# Patient Record
Sex: Male | Born: 1980 | Hispanic: Yes | Smoking: Never smoker
Health system: Southern US, Community
[De-identification: ages and names within clinical notes are randomized; demographics above are authoritative.]

---

## 2016-03-02 ENCOUNTER — Encounter (HOSPITAL_COMMUNITY): Payer: Self-pay | Admitting: *Deleted

## 2016-03-02 ENCOUNTER — Emergency Department (HOSPITAL_COMMUNITY)
Admission: EM | Admit: 2016-03-02 | Discharge: 2016-03-02 | Disposition: A | Discharge status: Home / Self Care | Payer: Self-pay | Attending: Emergency Medicine | Admitting: Emergency Medicine

## 2016-03-02 DIAGNOSIS — N200 Calculus of kidney: Secondary | ICD-10-CM | POA: Insufficient documentation

## 2016-03-02 LAB — URINE MICROSCOPIC-ADD ON

## 2016-03-02 LAB — URINALYSIS, ROUTINE W REFLEX MICROSCOPIC
Glucose, UA: NEGATIVE mg/dL
Ketones, ur: 15 mg/dL — AB
NITRITE: NEGATIVE
PROTEIN: 100 mg/dL — AB
SPECIFIC GRAVITY, URINE: 1.027 (ref 1.005–1.030)
pH: 5.5 (ref 5.0–8.0)

## 2016-03-02 LAB — COMPREHENSIVE METABOLIC PANEL
ALBUMIN: 4.3 g/dL (ref 3.5–5.0)
ALT: 43 U/L (ref 17–63)
ANION GAP: 10 (ref 5–15)
AST: 28 U/L (ref 15–41)
Alkaline Phosphatase: 64 U/L (ref 38–126)
BUN: 17 mg/dL (ref 6–20)
CHLORIDE: 103 mmol/L (ref 101–111)
CO2: 24 mmol/L (ref 22–32)
Calcium: 9.4 mg/dL (ref 8.9–10.3)
Creatinine, Ser: 0.89 mg/dL (ref 0.61–1.24)
GFR calc Af Amer: >60 mL/min (ref >60)
GLUCOSE: 138 mg/dL — AB (ref 65–99)
POTASSIUM: 3.6 mmol/L (ref 3.5–5.1)
Sodium: 137 mmol/L (ref 135–145)
Total Bilirubin: 0.7 mg/dL (ref 0.3–1.2)
Total Protein: 7.5 g/dL (ref 6.5–8.1)

## 2016-03-02 LAB — CBC
HCT: 42.4 % (ref 39.0–52.0)
HEMOGLOBIN: 14.3 g/dL (ref 13.0–17.0)
MCH: 29.5 pg (ref 26.0–34.0)
MCHC: 33.7 g/dL (ref 30.0–36.0)
MCV: 87.4 fL (ref 78.0–100.0)
PLATELETS: 273 10*3/uL (ref 150–400)
RBC: 4.85 MIL/uL (ref 4.22–5.81)
RDW: 12.9 % (ref 11.5–15.5)
WBC: 11.2 10*3/uL — AB (ref 4.0–10.5)

## 2016-03-02 LAB — LIPASE, BLOOD: LIPASE: 31 U/L (ref 11–51)

## 2016-03-02 MED ORDER — TRAMADOL HCL 50 MG PO TABS: 50.0000 mg | ORAL_TABLET | Freq: Two times a day (BID) | ORAL | 0 refills | Status: AC | PRN

## 2016-03-02 MED ORDER — ONDANSETRON 4 MG PO TBDP: 4.0000 mg | ORAL_TABLET | Freq: Three times a day (TID) | ORAL | 0 refills | Status: AC | PRN

## 2016-03-02 NOTE — ED Provider Notes (Signed)
[facility] Provider Note   CSN: [id number] Arrival date & time: 03/02/16  0108  By signing my name below, I, Phillis Haggis, attest that this documentation has been prepared under the direction and in the presence of Tomasita Crumble, MD. Electronically Signed: Phillis Haggis, ED Scribe. 03/02/16. 3:20 AM.  First Provider Contact:  None    History   Chief Complaint Chief Complaint  Patient presents with  . Abdominal Pain    The history is provided by the patient. No language interpreter was used.  HPI Comments: Benjamin Bowman is a 35 y.o. male who presents to the Emergency Department complaining of gradually worsening RLQ abdominal pain onset one day ago. Pt reports associated vomiting x3, darkened urine and mild hematuria. He reports mild pain right now. Wife states that pt drinks a lot of soda. He denies diaphoresis and dysuria. Pt speaks Spanish.   History reviewed. No pertinent past medical history.  There are no active problems to display for this patient.   History reviewed. No pertinent surgical history.   Home Medications    Prior to Admission medications   Not on File    Family History History reviewed. No pertinent family history.  Social History Social History  Substance Use Topics  . Smoking status: Never Smoker  . Smokeless tobacco: Never Used  . Alcohol use No     Allergies   Review of patient's allergies indicates no known allergies.   Review of Systems Review of Systems 10 Systems reviewed and all are negative for acute change except as noted in the HPI.  Physical Exam Updated Vital Signs BP 126/80 (BP Location: Left Arm)   Pulse 62   Temp 98.6 F (37 C) (Oral)   Resp 18   Ht  (1.676 m)   Wt 213 lb 8 oz (96.8 kg)   SpO2 99%   BMI 34.46 kg/m   Physical Exam  Constitutional: He is oriented to person, place, and time. Vital signs are normal. He appears well-developed and well-nourished.  Non-toxic appearance. He does  not appear ill. No distress.  HENT:  Head: Normocephalic and atraumatic.  Nose: Nose normal.  Mouth/Throat: Oropharynx is clear and moist. No oropharyngeal exudate.  Eyes: Conjunctivae and EOM are normal. Pupils are equal, round, and reactive to light. No scleral icterus.  Neck: Normal range of motion. Neck supple. No tracheal deviation, no edema, no erythema and normal range of motion present. No thyroid mass and no thyromegaly present.  Cardiovascular: Normal rate, regular rhythm, S1 normal, S2 normal, normal heart sounds, intact distal pulses and normal pulses.  Exam reveals no gallop and no friction rub.   No murmur heard. Pulmonary/Chest: Effort normal and breath sounds normal. No respiratory distress. He has no wheezes. He has no rhonchi. He has no rales.  Abdominal: Soft. Normal appearance and bowel sounds are normal. He exhibits no distension, no ascites and no mass. There is no hepatosplenomegaly. There is no tenderness. There is no rebound, no guarding and no CVA tenderness.  Musculoskeletal: Normal range of motion. He exhibits no edema or tenderness.  Lymphadenopathy:    He has no cervical adenopathy.  Neurological: He is alert and oriented to person, place, and time. He has normal strength. No cranial nerve deficit or sensory deficit.  Skin: Skin is warm, dry and intact. No petechiae and no rash noted. He is not diaphoretic. No erythema. No pallor.  Nursing note and vitals reviewed.  ED Treatments / Results  DIAGNOSTIC STUDIES: Oxygen Saturation is  99% on RA, normal by my interpretation.    COORDINATION OF CARE: 3:17 AM-Discussed treatment plan which includes labs with pt at bedside and pt agreed to plan.    Labs (all labs ordered are listed, but only abnormal results are displayed) Labs Reviewed  COMPREHENSIVE METABOLIC PANEL - Abnormal; Notable for the following:       Result Value   Glucose, Bld 138 (*)    All other components within normal limits  CBC - Abnormal;  Notable for the following:    WBC 11.2 (*)    All other components within normal limits  URINALYSIS, ROUTINE W REFLEX MICROSCOPIC (NOT AT [facility]) - Abnormal; Notable for the following:    Color, Urine AMBER (*)    APPearance TURBID (*)    Hgb urine dipstick LARGE (*)    Bilirubin Urine SMALL (*)    Ketones, ur 15 (*)    Protein, ur 100 (*)    Leukocytes, UA SMALL (*)    All other components within normal limits  URINE MICROSCOPIC-ADD ON - Abnormal; Notable for the following:    Squamous Epithelial / LPF 0-5 (*)    Bacteria, UA FEW (*)    All other components within normal limits  LIPASE, BLOOD    EKG  EKG Interpretation None       Radiology No results found.  Procedures Procedures (including critical care time)  Medications Ordered in ED Medications - No data to display   Initial Impression / Assessment and Plan / ED Course  I have reviewed the triage vital signs and the nursing notes.  Pertinent labs & imaging results that were available during my care of the patient were reviewed by me and considered in my medical decision making (see chart for details).  Clinical Course    Patient presents to the Ed for RLQ abd pain with radiating to the testicles with sweating and vomiting, consistent with kidney stone.  UA reveal hematuria.  Patient currently has no pain.  Cr is normal as well.  CT is not needed.  Education provided.  Will give tramadol for severe pain, zofran for nausea and urology follow up. He appears well and in NAD.  Vs remain within hisn ormal limits and he is safe for DC.  Final Clinical Impressions(s) / ED Diagnoses   Final diagnoses:  None    I personally performed the services described in this documentation, which was scribed in my presence. The recorded information has been reviewed and is accurate.    New Prescriptions New Prescriptions   No medications on file     Tomasita CrumbleAdeleke Zenia Guest, MD 03/02/16 [phone]

## 2016-03-02 NOTE — ED Triage Notes (Signed)
Pt c/o lower right abdominal pain since this afternoon. Pt also reports right testicle pain, denies testicle pain at this time. Pt reports emesis x 3, denies dysuria. Last BM 1500 yesterday afternoon and normal.

## 2018-11-22 ENCOUNTER — Encounter (HOSPITAL_COMMUNITY): Payer: Self-pay | Admitting: Emergency Medicine

## 2018-11-22 ENCOUNTER — Ambulatory Visit (HOSPITAL_COMMUNITY)
Admission: EM | Admit: 2018-11-22 | Discharge: 2018-11-22 | Disposition: A | Discharge status: Home / Self Care | Payer: Self-pay | Attending: Emergency Medicine | Admitting: Emergency Medicine

## 2018-11-22 ENCOUNTER — Other Ambulatory Visit: Payer: Self-pay

## 2018-11-22 ENCOUNTER — Ambulatory Visit (INDEPENDENT_AMBULATORY_CARE_PROVIDER_SITE_OTHER): Payer: Self-pay

## 2018-11-22 DIAGNOSIS — Z20822 Contact with and (suspected) exposure to covid-19: Secondary | ICD-10-CM

## 2018-11-22 DIAGNOSIS — R6889 Other general symptoms and signs: Secondary | ICD-10-CM

## 2018-11-22 DIAGNOSIS — J189 Pneumonia, unspecified organism: Secondary | ICD-10-CM

## 2018-11-22 MED ORDER — AMOXICILLIN-POT CLAVULANATE 875-125 MG PO TABS: 1.0000 | ORAL_TABLET | Freq: Two times a day (BID) | ORAL | 0 refills | Status: AC

## 2018-11-22 MED ORDER — AZITHROMYCIN 250 MG PO TABS: ORAL_TABLET | ORAL | 0 refills | Status: AC

## 2018-11-22 MED ORDER — ACETAMINOPHEN 325 MG PO TABS
ORAL_TABLET | ORAL | Status: AC
  Filled 2018-11-22: qty 2

## 2018-11-22 MED ORDER — ACETAMINOPHEN 325 MG PO TABS
650.0000 mg | ORAL_TABLET | Freq: Once | ORAL | Status: AC
  Administered 2018-11-22: 11:00:00 650 mg via ORAL

## 2018-11-22 MED ORDER — ACETAMINOPHEN 500 MG PO TABS: 1000.0000 mg | ORAL_TABLET | Freq: Four times a day (QID) | ORAL | 0 refills | Status: AC | PRN

## 2018-11-22 NOTE — Discharge Instructions (Signed)
la radiografa demuestra neumona. Voy a tratar para fuentes bacterianas comunes, pero la preocupacin actual es que Covid-19 cause esto. por favor complete el curso de antibiticos. Tylenol. ibuprofeno segn sea necesario para el dolor o la fiebre. beber mucho lquido. autoaislados durante al [employer] otros 7 das o hasta que haya estado sin fiebre durante 3 das, lo que sea ms [place]. si presenta un empeoramiento del dolor torcico, dificultad para respirar o dificultad para respirar, vaya al servicio de urgencias.

## 2018-11-22 NOTE — ED Provider Notes (Signed)
[facility]    CSN: [id number] Arrival date & time: 11/22/18  1046     History   Chief Complaint Chief Complaint  Patient presents with  . Fever    HPI Benjamin Bowman is a 38 y.o. male.   Benjamin Bowman presents with complaints of fever, fatigue, decreased appetite, mouth feels sour. Started approximately 10 days ago. Not worsening but not getting better. Cough, non productive. Shortness of breath. Chest pain . No known ill contacts. Out of work the past week due to illness, works doing Recruitment consultantelectricity with construction. None of coworkers ill that he is aware of. No nausea, vomiting or diarrhea. No skin rash. Mild Sore throat. No ear pain. Has been taking nyquil, took last night. No asthma, doesn't smoke. Without contributing medical history.    Spanish video interpreter used to collect history and physical exam.    ROS per HPI, negative if not otherwise mentioned.      History reviewed. No pertinent past medical history.  There are no active problems to display for this patient.   History reviewed. No pertinent surgical history.     Home Medications    Prior to Admission medications   Medication Sig Start Date End Date Taking? Authorizing Provider  acetaminophen (TYLENOL) 500 MG tablet Take 2 tablets (1,000 mg total) by mouth every 6 (six) hours as needed. 11/22/18   Georgetta HaberBurky, Meliton Samad B, NP  amoxicillin-clavulanate (AUGMENTIN) 875-125 MG tablet Take 1 tablet by mouth every 12 (twelve) hours for 10 days. 11/22/18 12/02/18  Georgetta HaberBurky, Kinberly Perris B, NP  azithromycin (ZITHROMAX) 250 MG tablet Take 2 tablets (500 mg total) by mouth daily for 1 day, THEN 1 tablet (250 mg total) daily for 4 days. 11/22/18 11/27/18  Georgetta HaberBurky, Omelia Marquart B, NP  ondansetron (ZOFRAN ODT) 4 MG disintegrating tablet Take 1 tablet (4 mg total) by mouth every 8 (eight) hours as needed for nausea or vomiting. 03/02/16   Tomasita Crumbleni, Adeleke, MD  traMADol (ULTRAM) 50 MG tablet Take 1 tablet (50 mg total) by mouth  every 12 (twelve) hours as needed. 03/02/16   Tomasita Crumbleni, Adeleke, MD    Family History Family History  Problem Relation Age of Onset  . Healthy Mother   . Healthy Father     Social History Social History   Tobacco Use  . Smoking status: Never Smoker  . Smokeless tobacco: Never Used  Substance Use Topics  . Alcohol use: No  . Drug use: No     Allergies   Patient has no known allergies.   Review of Systems Review of Systems   Physical Exam Triage Vital Signs ED Triage Vitals  Enc Vitals Group     BP 11/22/18 1113 123/75     Pulse Rate 11/22/18 1113 (!) 108     Resp 11/22/18 1113 20     Temp 11/22/18 1113 (!) 101.1 F (38.4 C)     Temp Source 11/22/18 1113 Oral     SpO2 11/22/18 1113 99 %     Weight --      Height --      Head Circumference --      Peak Flow --      Pain Score 11/22/18 1109 6     Pain Loc --      Pain Edu? --      Excl. in GC? --    No data found.  Updated Vital Signs BP 123/75 (BP Location: Left Arm)   Pulse (!) 108   Temp (!) 101.1 F (  38.4 C) (Oral)   Resp 20   SpO2 99%    Physical Exam Constitutional:      Appearance: Normal appearance. He is diaphoretic. He is not toxic-appearing.  HENT:     Mouth/Throat:     Mouth: Mucous membranes are moist.     Tonsils: No tonsillar exudate.  Eyes:     Conjunctiva/sclera: Conjunctivae normal.  Cardiovascular:     Rate and Rhythm: Normal rate.  Pulmonary:     Effort: Pulmonary effort is normal. No respiratory distress.  Musculoskeletal: Normal range of motion.  Neurological:     Mental Status: He is alert.    High suspicion COVID-19, limited physical exam. Speaking in complete sentences without difficulty. No cough throughout exam. No increased work of breathing.  UC Treatments / Results  Labs (all labs ordered are listed, but only abnormal results are displayed) Labs Reviewed - No data to display  EKG None  Radiology Dg Chest 2 View  Result Date: 11/22/2018 CLINICAL DATA:  Fever  and dry cough with shortness of breath. EXAM: CHEST - 2 VIEW COMPARISON:  None. FINDINGS: Low volume chest with streaky opacity in the mid and lower chest. Normal heart size. No effusion or pneumothorax. IMPRESSION: Patchy bilateral lung opacities, likely atypical infection. Electronically Signed   By: Marnee Spring M.D.   On: 11/22/2018 12:00    Procedures Procedures (including critical care time)  Medications Ordered in UC Medications  acetaminophen (TYLENOL) tablet 650 mg (650 mg Oral Given 11/22/18 1129)    Initial Impression / Assessment and Plan / UC Course  I have reviewed the triage vital signs and the nursing notes.  Pertinent labs & imaging results that were available during my care of the patient were reviewed by me and considered in my medical decision making (see chart for details).     10 days of illness, still with fever, shortness of breath, tachycardia. Bilateral pneumonia on xray. Will cover for CAP, however very suspicious for COVID-19. Discussed with patient with ER precautions. Self-isolation emphasized. Patient verbalized understanding and agreeable to plan.  Ambulatory out of clinic without difficulty.   Final Clinical Impressions(s) / UC Diagnoses   Final diagnoses:  Suspected Covid-19 Virus Infection  Community acquired pneumonia, unspecified laterality     Discharge Instructions     la radiografa demuestra neumona. Voy a tratar para fuentes bacterianas comunes, pero la preocupacin actual es que Covid-19 cause esto. por favor complete el curso de antibiticos. Tylenol. ibuprofeno segn sea necesario para el dolor o la fiebre. beber mucho lquido. autoaislados durante al [employer] otros 7 das o hasta que haya estado sin fiebre durante 3 das, lo que sea ms [place]. si presenta un empeoramiento del dolor torcico, dificultad para respirar o dificultad para respirar, vaya al servicio de urgencias.     ED Prescriptions    Medication Sig Dispense Auth. Provider    acetaminophen (TYLENOL) 500 MG tablet Take 2 tablets (1,000 mg total) by mouth every 6 (six) hours as needed. 30 tablet Linus Mako B, NP   azithromycin (ZITHROMAX) 250 MG tablet Take 2 tablets (500 mg total) by mouth daily for 1 day, THEN 1 tablet (250 mg total) daily for 4 days. 6 tablet Linus Mako B, NP   amoxicillin-clavulanate (AUGMENTIN) 875-125 MG tablet Take 1 tablet by mouth every 12 (twelve) hours for 10 days. 20 tablet Georgetta Haber, NP     Controlled Substance Prescriptions [place] Controlled Substance Registry consulted? Not Applicable   Georgetta Haber, NP 11/22/18  1237  

## 2018-11-22 NOTE — ED Triage Notes (Signed)
"  10 days with a cough and cold.  My body feels tired." Poor appetite, "mouth feels sour".    Has a cough Has sob Reports feeling feverish Reports chills "a lot" Reports sore throat

## 2018-11-25 ENCOUNTER — Emergency Department (HOSPITAL_COMMUNITY): Payer: HRSA Program

## 2018-11-25 ENCOUNTER — Encounter (HOSPITAL_COMMUNITY): Payer: Self-pay

## 2018-11-25 ENCOUNTER — Other Ambulatory Visit: Payer: Self-pay

## 2018-11-25 ENCOUNTER — Emergency Department (HOSPITAL_COMMUNITY)
Admission: EM | Admit: 2018-11-25 | Discharge: 2018-11-25 | Disposition: A | Discharge status: Home / Self Care | Payer: HRSA Program | Attending: Emergency Medicine | Admitting: Emergency Medicine

## 2018-11-25 DIAGNOSIS — U071 COVID-19: Secondary | ICD-10-CM | POA: Diagnosis not present

## 2018-11-25 DIAGNOSIS — R0602 Shortness of breath: Secondary | ICD-10-CM | POA: Diagnosis present

## 2018-11-25 DIAGNOSIS — R6889 Other general symptoms and signs: Principal | ICD-10-CM

## 2018-11-25 DIAGNOSIS — Z20822 Contact with and (suspected) exposure to covid-19: Secondary | ICD-10-CM

## 2018-11-25 MED ORDER — GUAIFENESIN-CODEINE 100-10 MG/5ML PO SOLN
5.0000 mL | Freq: Once | ORAL | Status: AC
  Administered 2018-11-25: 5 mL via ORAL
  Filled 2018-11-25: qty 5

## 2018-11-25 MED ORDER — GUAIFENESIN-CODEINE 100-10 MG/5ML PO SOLN: 5.0000 mL | Freq: Three times a day (TID) | ORAL | 0 refills | Status: AC | PRN

## 2018-11-25 NOTE — ED Provider Notes (Signed)
[facility] EMERGENCY DEPARTMENT Provider Note   CSN: [id number] Arrival date & time: 11/25/18  1842    History   Chief Complaint Chief Complaint  Patient presents with  . Shortness of Breath    HPI Cray Monnin is a 38 y.o. male. with no PMHx of who presents to the ED with SOB for the past two weeks. States two weeks ago he developed a fever, cough and SOB. Went to an urgent care on 5/3 after he continued to have a fever for 10 days. States at that time he was diagnosed with PNA and started on abx. Has been feeling better but has had a worsening cough and Dyspnea on exertion due to coughing. States he has not had a fever since he was started on abx.     HPI  History reviewed. No pertinent past medical history.  There are no active problems to display for this patient.   History reviewed. No pertinent surgical history.      Home Medications    Prior to Admission medications   Medication Sig Start Date End Date Taking? Authorizing Provider  acetaminophen (TYLENOL) 500 MG tablet Take 2 tablets (1,000 mg total) by mouth every 6 (six) hours as needed. 11/22/18   Georgetta Haber, NP  amoxicillin-clavulanate (AUGMENTIN) 875-125 MG tablet Take 1 tablet by mouth every 12 (twelve) hours for 10 days. 11/22/18 12/02/18  Georgetta Haber, NP  azithromycin (ZITHROMAX) 250 MG tablet Take 2 tablets (500 mg total) by mouth daily for 1 day, THEN 1 tablet (250 mg total) daily for 4 days. 11/22/18 11/27/18  Georgetta Haber, NP  guaiFENesin-codeine 100-10 MG/5ML syrup Take 5 mLs by mouth 3 (three) times daily as needed for up to 5 days for cough. 11/25/18 11/30/18  Dicky Doe, MD  ondansetron (ZOFRAN ODT) 4 MG disintegrating tablet Take 1 tablet (4 mg total) by mouth every 8 (eight) hours as needed for nausea or vomiting. 03/02/16   Tomasita Crumble, MD  traMADol (ULTRAM) 50 MG tablet Take 1 tablet (50 mg total) by mouth every 12 (twelve) hours as needed. 03/02/16   Tomasita Crumble, MD     Family History Family History  Problem Relation Age of Onset  . Healthy Mother   . Healthy Father     Social History Social History   Tobacco Use  . Smoking status: Never Smoker  . Smokeless tobacco: Never Used  Substance Use Topics  . Alcohol use: No  . Drug use: No     Allergies   Patient has no known allergies.   Review of Systems Review of Systems  Constitutional: Positive for activity change, appetite change, fatigue and fever.  HENT: Positive for congestion.   Respiratory: Positive for cough and shortness of breath.   Cardiovascular: Negative for chest pain.  Gastrointestinal: Negative for abdominal pain, constipation, diarrhea, nausea and vomiting.  Genitourinary: Negative for difficulty urinating and dysuria.  Musculoskeletal: Negative for back pain and neck pain.  Neurological: Negative for headaches.  All other systems reviewed and are negative.    Physical Exam Updated Vital Signs BP 120/76   Pulse 70   Temp 98.3 F (36.8 C) (Oral)   Resp (!) 31   Ht  (1.753 m)   Wt 106.6 kg   SpO2 96%   BMI 34.70 kg/m   Physical Exam Vitals signs and nursing note reviewed.  Constitutional:      General: He is not in acute distress.    Appearance: He is  well-developed.  HENT:     Head: Normocephalic and atraumatic.  Eyes:     Conjunctiva/sclera: Conjunctivae normal.     Pupils: Pupils are equal, round, and reactive to light.  Neck:     Musculoskeletal: Neck supple.  Cardiovascular:     Rate and Rhythm: Normal rate and regular rhythm.     Heart sounds: No murmur.  Pulmonary:     Effort: Pulmonary effort is normal. No respiratory distress.     Breath sounds: Normal breath sounds. No decreased breath sounds, wheezing, rhonchi or rales.  Chest:     Chest wall: No tenderness.  Abdominal:     Palpations: Abdomen is soft.     Tenderness: There is no abdominal tenderness.  Musculoskeletal:     Right lower leg: No edema.     Left lower leg: No  edema.  Skin:    General: Skin is warm and dry.  Neurological:     Mental Status: He is alert.      ED Treatments / Results  Labs (all labs ordered are listed, but only abnormal results are displayed) Labs Reviewed  NOVEL CORONAVIRUS, [facility] ORDER, SEND-OUT TO REF LAB)    EKG EKG Interpretation  Date/Time:  Wednesday Nov 25 2018 19:15:17 EDT Ventricular Rate:  79 PR Interval:    QRS Duration: 110 QT Interval:  400 QTC Calculation: 459 R Axis:   124 Text Interpretation:  Sinus rhythm Right axis deviation Minimal ST elevation, lateral leads No old tracing to compare Confirmed by Jacalyn Lefevre [phone]) on 11/25/2018 7:31:18 PM   Radiology Dg Chest Portable 1 View  Result Date: 11/25/2018 CLINICAL DATA:  Shortness of breath and cough for several days EXAM: PORTABLE CHEST 1 VIEW COMPARISON:  11/22/2018 FINDINGS: Cardiac shadow is mildly prominent but accentuated by the portable technique. Patchy infiltrate is noted in both lungs increased from the prior exam. No for sizable effusion is noted. No bony abnormality is seen. IMPRESSION: Patchy bilateral infiltrate slightly more prominent than that seen on the prior exam and again likely related to atypical infection. Electronically Signed   By: Alcide Clever M.D.   On: 11/25/2018 20:57    Procedures Procedures (including critical care time)  Medications Ordered in ED Medications  guaiFENesin-codeine 100-10 MG/5ML solution 5 mL (5 mLs Oral Given 11/25/18 2017)     Initial Impression / Assessment and Plan / ED Course  I have reviewed the triage vital signs and the nursing notes.  Pertinent labs & imaging results that were available during my care of the patient were reviewed by me and considered in my medical decision making (see chart for details).         Fines Suhre was evaluated in Emergency Department on 11/25/2018 for the symptoms described in the history of present illness. He was evaluated in the context of  the global COVID-19 pandemic, which necessitated consideration that the patient might be at risk for infection with the SARS-CoV-2 virus that causes COVID-19. Institutional protocols and algorithms that pertain to the evaluation of patients at risk for COVID-19 are in a state of rapid change based on information released by regulatory bodies including the CDC and federal and state organizations. These policies and algorithms were followed during the patient's care in the ED.    MDM: Hakeem Ursin is a 38 y.o. male with no PMHx of who presents to the ED with SOB for the past two weeks. States two weeks ago he developed a fever, cough and SOB. Went to  an urgent care on 5/3 after he continued to have a fever for 10 days. States at that time he was diagnosed with PNA and started on abx. Has been feeling better but has had a worsening cough and Dyspnea on exertion due to coughing. States he has not had a fever since he was started on abx.   On initial exam patient is appears ill but not in acute distress. VSS. Afebrile and satting well on RA. Physical exam as above shows clear lungs to auscultation.   Clinical impression is a viral URI.  Prior to symptoms starting patient was working as a Corporate investment bankerconstruction worker. Unsure if he was exposed to COVID, however high suspicion given his history. Will obtain CXR and screening EKG.   ED interpretation of EKG: as above ED interpretation of Imaging: CXR shows Patchy bilateral infiltrate slightly more prominent than that seen CXR (5/3)    Medications given in ED: guaifenesin with codeine.    On reassessment patient continues to sat well on RA (97%). He is mildly tachypneic. At this time patient stable for discharge home. Strict return precautions given. Lengthy discussion had with patient in regards to results and pending COVID test that will result in 1-2 days  And the need/requirement to self isolate. Patient states understanding. All questions answered.    Final Clinical Impressions(s) / ED Diagnoses   Final diagnoses:  Suspected Covid-19 Virus Infection  SOB (shortness of breath)    ED Discharge Orders         Ordered    guaiFENesin-codeine 100-10 MG/5ML syrup  3 times daily PRN     11/25/18 2112           Dicky DoeFord, Jim Lundin, MD 11/25/18 2145    Jacalyn LefevreHaviland, Julie, MD 11/25/18 2300

## 2018-11-25 NOTE — ED Triage Notes (Signed)
Pt began having SOB during exertion & having intermittent fevers several days ago. States that his chest hurts when he coughs & is mainly here to get medicine for his cough & for the pain in his lungs when he coughs. (per pt)

## 2018-11-27 LAB — NOVEL CORONAVIRUS, NAA (HOSP ORDER, SEND-OUT TO REF LAB; TAT 18-24 HRS): SARS-CoV-2, NAA: DETECTED — AB

## 2018-12-09 ENCOUNTER — Emergency Department (HOSPITAL_COMMUNITY)
Admission: EM | Admit: 2018-12-09 | Discharge: 2018-12-09 | Disposition: A | Discharge status: Home / Self Care | Payer: Self-pay | Attending: Emergency Medicine | Admitting: Emergency Medicine

## 2018-12-09 ENCOUNTER — Other Ambulatory Visit: Payer: Self-pay

## 2018-12-09 DIAGNOSIS — Z7689 Persons encountering health services in other specified circumstances: Secondary | ICD-10-CM | POA: Insufficient documentation

## 2018-12-09 NOTE — ED Triage Notes (Signed)
Pt states he tested positive x2 weeks ago ; pt states his boss wont let him back to work unless he has " confirmation " that he is covid negative ; pt denies any further symptoms at this time ; pt states " Im ready to go back to work "

## 2018-12-09 NOTE — ED Provider Notes (Signed)
[facility]   CSN: [id number] Arrival date & time: 12/09/18  1235    History   Chief Complaint Chief Complaint  Patient presents with  . NEEDS WORK NOTE    HPI Benjamin Bowman is a 38 y.o. male.     A language interpreter was used ([place]).     Benjamin Bowman is a 38 y.o. male, patient with no pertinent past medical history, presenting to the ED requesting a work note to go back to work.  On May 6, patient presented to the ED with complaint of shortness of breath, cough, and fever.  He tested positive for coronavirus COVID-19 and was told to isolate for 2 weeks. He states he was compliant with isolation.  Fever resolved by around May 9 and other symptoms resolved completely by May 16. He denies any fever, cough, upper respiratory congestion, sore throat, change in taste or smell, chest pain, shortness of breath.    No past medical history on file.  There are no active problems to display for this patient.   No past surgical history on file.      Home Medications    Prior to Admission medications   Medication Sig Start Date End Date Taking? Authorizing Provider  acetaminophen (TYLENOL) 500 MG tablet Take 2 tablets (1,000 mg total) by mouth every 6 (six) hours as needed. 11/22/18   Georgetta HaberBurky, Natalie B, NP  ondansetron (ZOFRAN ODT) 4 MG disintegrating tablet Take 1 tablet (4 mg total) by mouth every 8 (eight) hours as needed for nausea or vomiting. 03/02/16   Tomasita Crumbleni, Adeleke, MD  traMADol (ULTRAM) 50 MG tablet Take 1 tablet (50 mg total) by mouth every 12 (twelve) hours as needed. 03/02/16   Tomasita Crumbleni, Adeleke, MD    Family History Family History  Problem Relation Age of Onset  . Healthy Mother   . Healthy Father     Social History Social History   Tobacco Use  . Smoking status: Never Smoker  . Smokeless tobacco: Never Used  Substance Use Topics  . Alcohol use: No  . Drug use: No     Allergies    Patient has no known allergies.   Review of Systems Review of Systems  Constitutional: Negative for chills and fever.       Note to return back to work  Respiratory: Negative for cough and shortness of breath.   Cardiovascular: Negative for chest pain.  Gastrointestinal: Negative for abdominal pain, diarrhea, nausea and vomiting.  Musculoskeletal: Negative for myalgias.  Neurological: Negative for weakness.     Physical Exam Updated Vital Signs BP 129/83   Pulse 78   Temp 99 F (37.2 C) (Oral)   Resp 18   Ht 5\' 9"  (1.753 m)   Wt 106 kg   SpO2 99%   BMI 34.51 kg/m   Physical Exam Vitals signs and nursing note reviewed.  Constitutional:      General: He is not in acute distress.    Appearance: He is well-developed. He is not diaphoretic.  HENT:     Head: Normocephalic and atraumatic.     Mouth/Throat:     Mouth: Mucous membranes are moist.     Pharynx: Oropharynx is clear.  Eyes:     Conjunctiva/sclera: Conjunctivae normal.  Neck:     Musculoskeletal: Neck supple.  Cardiovascular:     Rate and Rhythm: Normal rate and regular rhythm.     Pulses: Normal pulses.  Radial pulses are 2+ on the right side and 2+ on the left side.       Posterior tibial pulses are 2+ on the right side and 2+ on the left side.     Heart sounds: Normal heart sounds.  Pulmonary:     Effort: Pulmonary effort is normal. No respiratory distress.     Breath sounds: Normal breath sounds.  Abdominal:     Tenderness: There is no guarding.  Musculoskeletal:     Right lower leg: No edema.     Left lower leg: No edema.  Lymphadenopathy:     Cervical: No cervical adenopathy.  Skin:    General: Skin is warm and dry.  Neurological:     Mental Status: He is alert.  Psychiatric:        Mood and Affect: Mood and affect normal.        Speech: Speech normal.        Behavior: Behavior normal.      ED Treatments / Results  Labs (all labs ordered are listed, but only abnormal results  are displayed) Labs Reviewed - No data to display  EKG None  Radiology No results found.  Procedures Procedures (including critical care time)  Medications Ordered in ED Medications - No data to display   Initial Impression / Assessment and Plan / ED Course  I have reviewed the triage vital signs and the nursing notes.  Pertinent labs & imaging results that were available during my care of the patient were reviewed by me and considered in my medical decision making (see chart for details).        Patient presents requesting a work note to return back to work.  He had a positive coronavirus COVID-19 test on May 6.  His symptoms, including fever, have been resolved for at least 3 days.  Current guidelines do not recommend repeat testing. This information was passed on to the patient, who voiced understanding.     Final Clinical Impressions(s) / ED Diagnoses   Final diagnoses:  Return to work exam    ED Discharge Orders    None       Concepcion Living 12/09/18 1327    Geoffery Lyons, MD 12/09/18 [phone]

## 2018-12-09 NOTE — Discharge Instructions (Signed)
Patients who have symptoms consistent with COVID-19 should self isolated for: At least 3 days (72 hours) have passed since recovery, defined as resolution of fever without the use of fever reducing medications and improvement in respiratory symptoms (e.g., cough, shortness of breath), and At least 7 days have passed since symptoms first appeared.  Patients with a positive test may return to normal activity after isolation for 2 weeks as long as symptoms have resolved.  Repeat testing is not recommended.

## 2018-12-23 ENCOUNTER — Telehealth: Payer: Self-pay | Admitting: *Deleted

## 2018-12-23 NOTE — Telephone Encounter (Signed)
I used [employer] [phone] to call pt in Spanish.  I asked if he would be willing to donate plasma to assist others with COVID-19 recovery.   I explained reason for using his plasma with the antibodies and process of donation.    He did not want to donate.    I thanked him.

## 2020-07-31 IMAGING — DX PORTABLE CHEST - 1 VIEW
1 series · 1 of 1 positions shown · non-contrast
Comparison: 11/22/2018

CLINICAL DATA: Shortness of breath and cough for several days

EXAM:
PORTABLE CHEST 1 VIEW

[chest ap]
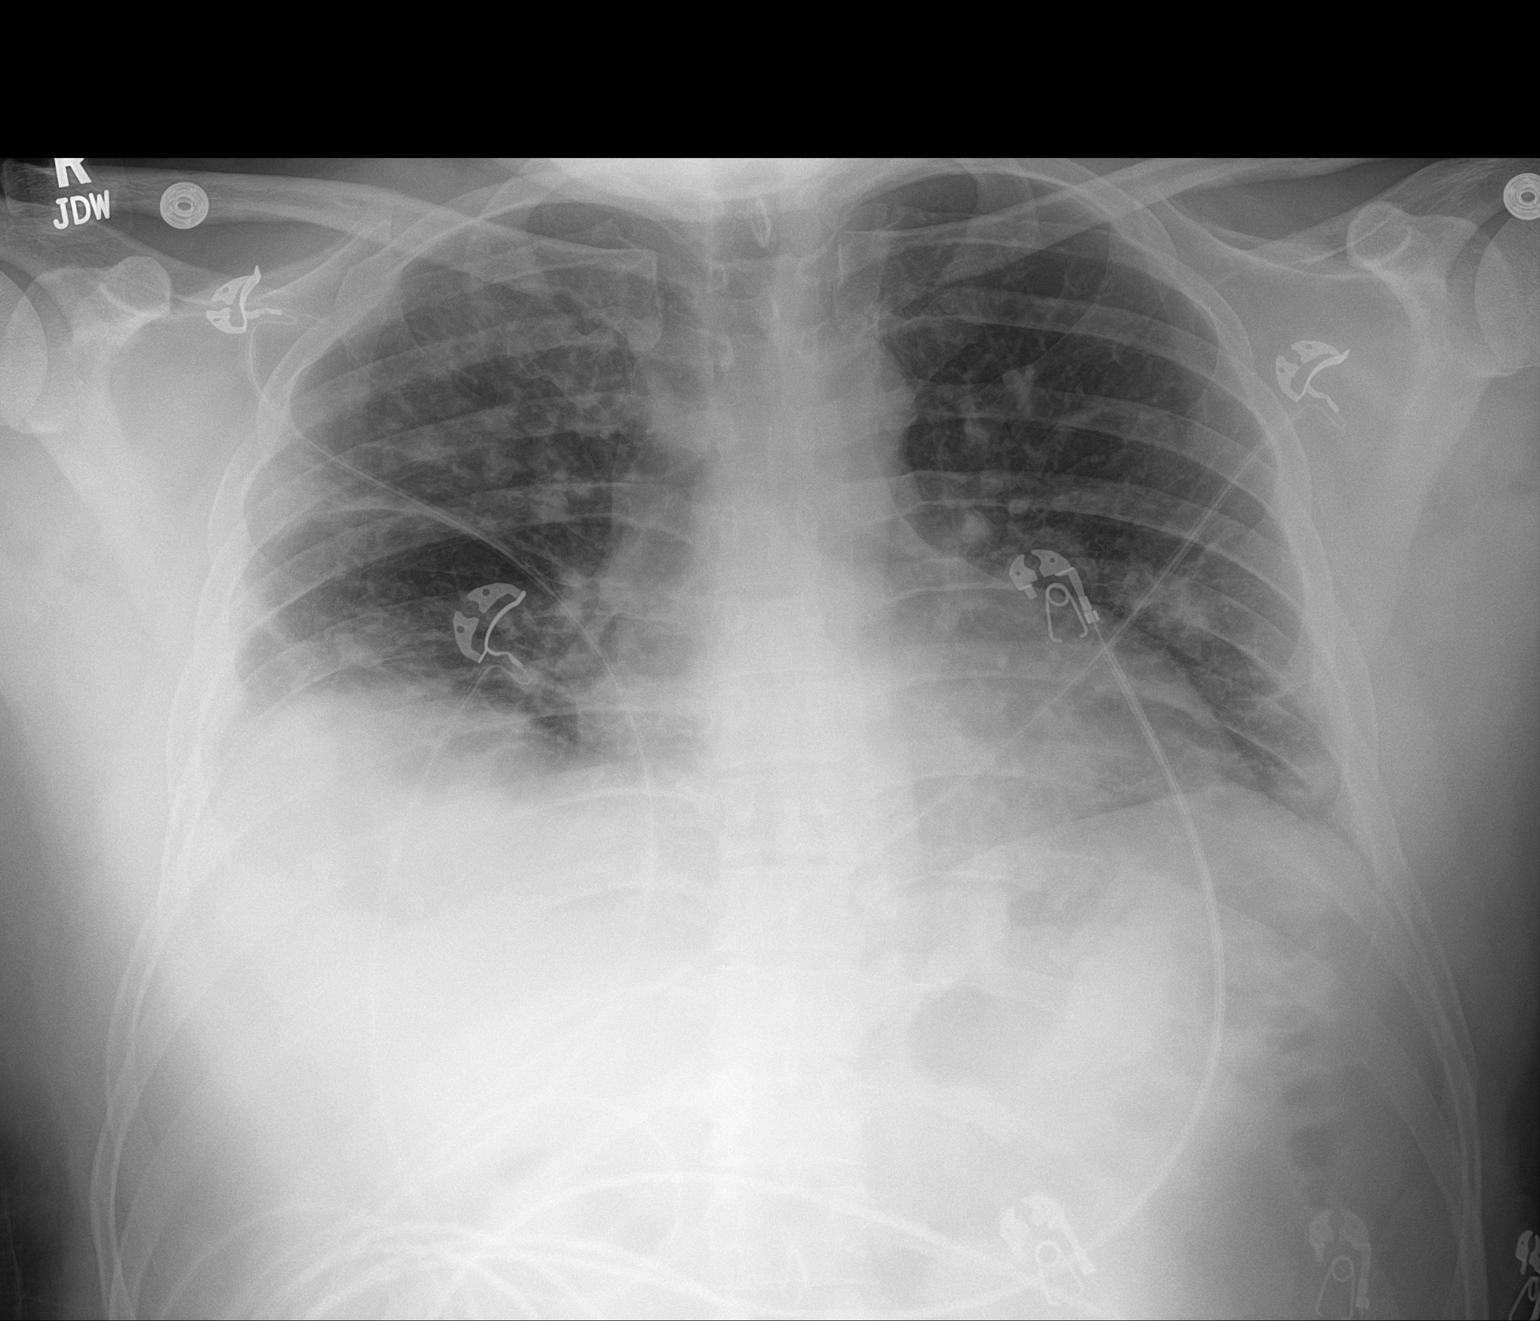

[1 of 1 positions shown; findings below may reference images not displayed]

FINDINGS: Cardiac shadow is mildly prominent but accentuated by the portable
technique. Patchy infiltrate is noted in both lungs increased from
the prior exam. No for sizable effusion is noted. No bony
abnormality is seen.
IMPRESSION: Patchy bilateral infiltrate slightly more prominent than that seen
on the prior exam and again likely related to atypical infection.
